# Patient Record
Sex: Male | Born: 1940 | Race: White | Hispanic: No | Marital: Married | Smoking: Former smoker
Health system: Southern US, Community
[De-identification: ages and names within clinical notes are randomized; demographics above are authoritative.]

## PROBLEM LIST (undated history)

## (undated) DIAGNOSIS — I10 Essential (primary) hypertension: Secondary | ICD-10-CM

## (undated) DIAGNOSIS — N289 Disorder of kidney and ureter, unspecified: Secondary | ICD-10-CM

## (undated) DIAGNOSIS — C480 Malignant neoplasm of retroperitoneum: Secondary | ICD-10-CM

## (undated) HISTORY — PX: OTHER SURGICAL HISTORY: SHX169

## (undated) HISTORY — DX: Essential (primary) hypertension: I10

## (undated) HISTORY — PX: CATARACT EXTRACTION: SUR2

## (undated) HISTORY — PX: EYE SURGERY: SHX253

---

## 2000-05-21 HISTORY — PX: HERNIA REPAIR: SHX51

## 2005-05-21 HISTORY — PX: ABDOMINAL SURGERY: SHX537

## 2005-07-17 ENCOUNTER — Ambulatory Visit (HOSPITAL_COMMUNITY): Admission: RE | Admit: 2005-07-17 | Discharge: 2005-07-17 | Payer: Self-pay | Admitting: Ophthalmology

## 2006-03-22 ENCOUNTER — Encounter (INDEPENDENT_AMBULATORY_CARE_PROVIDER_SITE_OTHER): Payer: Self-pay | Admitting: Specialist

## 2006-03-22 ENCOUNTER — Inpatient Hospital Stay (HOSPITAL_COMMUNITY): Admission: RE | Admit: 2006-03-22 | Discharge: 2006-03-29 | Payer: Self-pay

## 2006-04-17 ENCOUNTER — Ambulatory Visit: Payer: Self-pay | Admitting: Hematology & Oncology

## 2006-04-25 LAB — CBC WITH DIFFERENTIAL/PLATELET
Eosinophils Absolute: 0.2 10*3/uL (ref 0.0–0.5)
MONO#: 0.6 10*3/uL (ref 0.1–0.9)
NEUT#: 2.8 10*3/uL (ref 1.5–6.5)
RBC: 4.26 10*6/uL (ref 4.20–5.71)
RDW: 22.1 % — ABNORMAL HIGH (ref 11.2–14.6)
WBC: 5 10*3/uL (ref 4.0–10.0)

## 2006-04-25 LAB — COMPREHENSIVE METABOLIC PANEL
ALT: 10 U/L (ref 0–53)
Albumin: 4 g/dL (ref 3.5–5.2)
Alkaline Phosphatase: 66 U/L (ref 39–117)
CO2: 28 mEq/L (ref 19–32)
Glucose, Bld: 118 mg/dL — ABNORMAL HIGH (ref 70–99)
Potassium: 4.1 mEq/L (ref 3.5–5.3)
Sodium: 142 mEq/L (ref 135–145)
Total Protein: 6.3 g/dL (ref 6.0–8.3)

## 2006-04-25 LAB — LACTATE DEHYDROGENASE: LDH: 134 U/L (ref 94–250)

## 2006-04-29 ENCOUNTER — Ambulatory Visit: Admission: RE | Admit: 2006-04-29 | Discharge: 2006-07-28 | Payer: Self-pay | Admitting: Radiation Oncology

## 2006-05-13 ENCOUNTER — Ambulatory Visit (HOSPITAL_COMMUNITY): Admission: RE | Admit: 2006-05-13 | Discharge: 2006-05-13 | Payer: Self-pay | Admitting: Radiation Oncology

## 2006-05-29 ENCOUNTER — Ambulatory Visit (HOSPITAL_COMMUNITY): Admission: RE | Admit: 2006-05-29 | Discharge: 2006-05-29 | Payer: Self-pay | Admitting: Hematology & Oncology

## 2006-07-15 ENCOUNTER — Ambulatory Visit (HOSPITAL_COMMUNITY): Admission: RE | Admit: 2006-07-15 | Discharge: 2006-07-15 | Payer: Self-pay | Admitting: Radiation Oncology

## 2006-08-06 ENCOUNTER — Ambulatory Visit: Admission: RE | Admit: 2006-08-06 | Discharge: 2006-09-25 | Payer: Self-pay | Admitting: Radiation Oncology

## 2006-10-18 ENCOUNTER — Ambulatory Visit (HOSPITAL_COMMUNITY): Admission: RE | Admit: 2006-10-18 | Discharge: 2006-10-18 | Payer: Self-pay | Admitting: Radiation Oncology

## 2007-01-21 ENCOUNTER — Ambulatory Visit: Admission: RE | Admit: 2007-01-21 | Discharge: 2007-02-11 | Payer: Self-pay | Admitting: Radiation Oncology

## 2007-01-21 LAB — BASIC METABOLIC PANEL
CO2: 25 mEq/L (ref 19–32)
Calcium: 9.8 mg/dL (ref 8.4–10.5)
Creatinine, Ser: 1.23 mg/dL (ref 0.40–1.50)
Sodium: 141 mEq/L (ref 135–145)

## 2007-01-21 LAB — CBC WITH DIFFERENTIAL/PLATELET
BASO%: 0.3 % (ref 0.0–2.0)
EOS%: 3.4 % (ref 0.0–7.0)
LYMPH%: 11 % — ABNORMAL LOW (ref 14.0–48.0)
MCH: 35.7 pg — ABNORMAL HIGH (ref 28.0–33.4)
MCHC: 36.2 g/dL — ABNORMAL HIGH (ref 32.0–35.9)
MONO#: 0.6 10*3/uL (ref 0.1–0.9)
Platelets: 150 10*3/uL (ref 145–400)
RBC: 4.64 10*6/uL (ref 4.20–5.71)
WBC: 4.8 10*3/uL (ref 4.0–10.0)

## 2007-01-31 ENCOUNTER — Ambulatory Visit (HOSPITAL_COMMUNITY): Admission: RE | Admit: 2007-01-31 | Discharge: 2007-01-31 | Payer: Self-pay | Admitting: Radiation Oncology

## 2007-08-06 ENCOUNTER — Ambulatory Visit (HOSPITAL_COMMUNITY): Admission: RE | Admit: 2007-08-06 | Discharge: 2007-08-06 | Payer: Self-pay | Admitting: Radiation Oncology

## 2008-03-15 ENCOUNTER — Ambulatory Visit: Admission: RE | Admit: 2008-03-15 | Discharge: 2008-03-15 | Payer: Self-pay | Admitting: Radiation Oncology

## 2008-03-15 LAB — COMPREHENSIVE METABOLIC PANEL
Albumin: 4.5 g/dL (ref 3.5–5.2)
CO2: 27 mEq/L (ref 19–32)
Calcium: 9.2 mg/dL (ref 8.4–10.5)
Glucose, Bld: 124 mg/dL — ABNORMAL HIGH (ref 70–99)
Potassium: 3.8 mEq/L (ref 3.5–5.3)
Sodium: 134 mEq/L — ABNORMAL LOW (ref 135–145)
Total Protein: 7 g/dL (ref 6.0–8.3)

## 2008-03-15 LAB — CBC WITH DIFFERENTIAL/PLATELET
Eosinophils Absolute: 0.2 10*3/uL (ref 0.0–0.5)
LYMPH%: 12.1 % — ABNORMAL LOW (ref 14.0–48.0)
MONO#: 0.5 10*3/uL (ref 0.1–0.9)
NEUT#: 3.2 10*3/uL (ref 1.5–6.5)
Platelets: 130 10*3/uL — ABNORMAL LOW (ref 145–400)
RBC: 4.46 10*6/uL (ref 4.20–5.71)
WBC: 4.5 10*3/uL (ref 4.0–10.0)

## 2008-03-19 ENCOUNTER — Ambulatory Visit (HOSPITAL_COMMUNITY): Admission: RE | Admit: 2008-03-19 | Discharge: 2008-03-19 | Payer: Self-pay | Admitting: Radiation Oncology

## 2008-09-06 ENCOUNTER — Ambulatory Visit: Admission: RE | Admit: 2008-09-06 | Discharge: 2008-09-06 | Payer: Self-pay | Admitting: Radiation Oncology

## 2008-09-06 LAB — CREATININE, SERUM: Creatinine, Ser: 1.53 mg/dL — ABNORMAL HIGH (ref 0.40–1.50)

## 2008-09-15 ENCOUNTER — Ambulatory Visit (HOSPITAL_COMMUNITY): Admission: RE | Admit: 2008-09-15 | Discharge: 2008-09-15 | Payer: Self-pay | Admitting: Radiation Oncology

## 2009-01-04 IMAGING — PT NM PET TUM IMG SKULL BASE T - THIGH
6 series · 25 of 25 positions shown · non-contrast
Comparison: Prior CT studies of 05/13/06.   There are no prior PET scans for comparison.

CLINICAL DATA: The patient is status post surgical resection of a large liposarcoma of the retroperitoneum on 03/22/06.  Postop for staging CT on 05/13/06 demonstrated two small right-sided pulmonary nodules.  Further evaluation is now performed with PET scan. 
FDG PET-CT TUMOR IMAGING (SKULL BASE TO THIGHS):
Patient Height:  6 feet, 0 inches. 
Fasting Blood Glucose:  98.
TECHNIQUE: 17.7 mCi F-18 FDG were administered via right antecubital fossa.  Full ring PET imaging was performed from the skull base through the mid-thighs 59 minutes after injection.  CT data was obtained and used for attenuation correction and anatomic localization only.  (This was not acquired as a diagnostic CT examination).

[Series 1: pet ac · axial · 3.3mm · 4.69mm/px · z∈[-892,-22]mm · 5 of 267 slices shown]
[im 1/267]
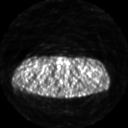
[im 67/267]
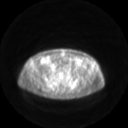
[im 134/267]
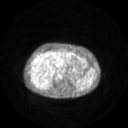
[im 200/267]
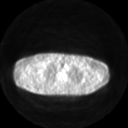
[im 267/267]
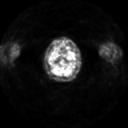

[Series 2: pet nac · axial · 3.3mm · 4.69mm/px · z∈[-892,-22]mm · 6 of 267 slices shown]
[im 1/267]
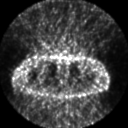
[im 54/267]
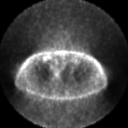
[im 107/267]
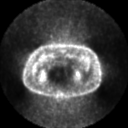
[im 160/267]
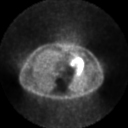
[im 213/267]
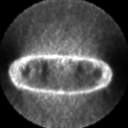
[im 267/267]
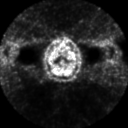

[Series 2: ct images · axial · 3.8mm · 0.98mm/px · z∈[-892,-22]mm · 5 of 267 slices shown]
[im 1/267]
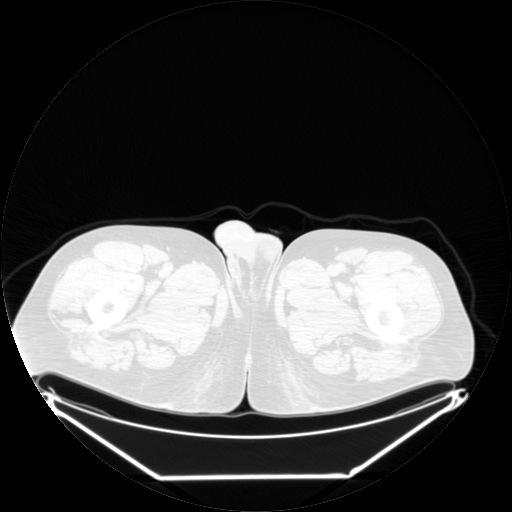
[im 67/267]
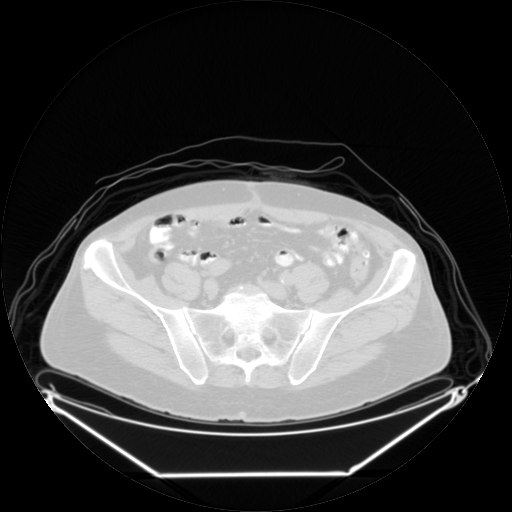
[im 134/267]
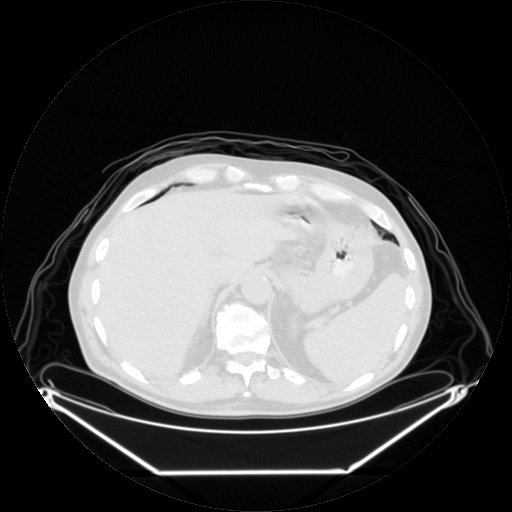
[im 200/267]
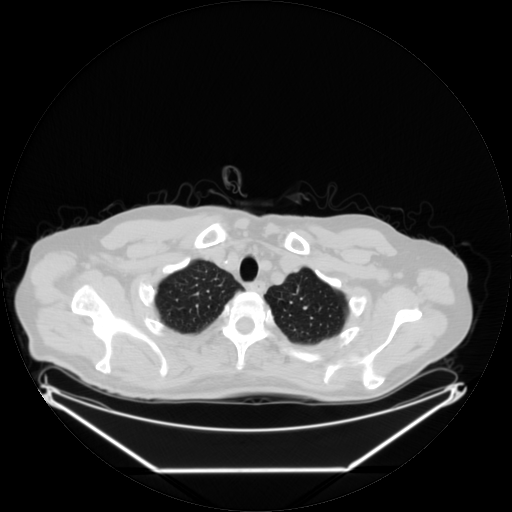
[im 267/267  brain]
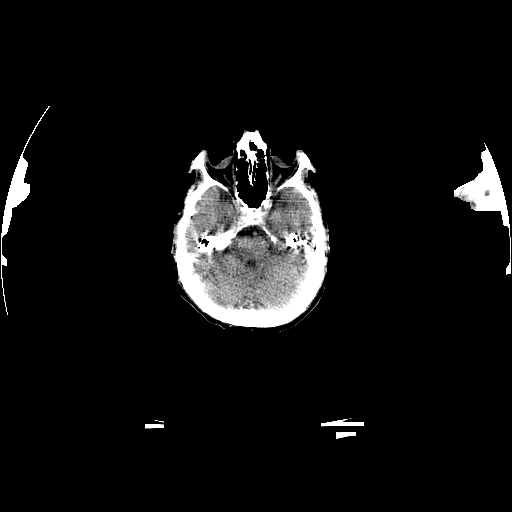

[Series 123: mip · coronal · 3.3mm · 4.69mm/px · 1 of 30 slices shown]
[im 1/30]
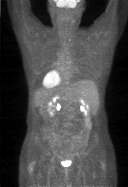

[Series 151: reformatted · axial · 3.3mm · 3.91mm/px · z∈[-892,-22]mm · 6 of 265 slices shown (1 of 2)]
[im 1/265]
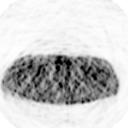
[im 53/265]
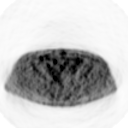
[im 106/265]
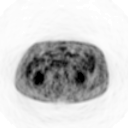
[im 159/265]
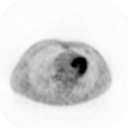
[im 212/265]
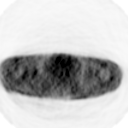
[im 265/265]
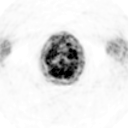

[Series 154: reformatted · coronal · 3.3mm · 6.98mm/px · 2 of 82 slices shown (2 of 2)]
[im 1/82]
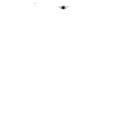
[im 82/82]
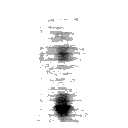

[25 of 25 positions shown; findings below may reference images not displayed]

FINDINGS: In the chest, no abnormal metabolic activity is identified by PET.  At the level of the two small right-sided pulmonary nodules, increased metabolic activity is not detectable.  However, given the small size of these nodules as well as the history of liposarcoma, it is definitely conceivable that small metastatic deposits would not be obviously hypermetabolic by PET scan, especially if the tumor is fairly well differentiated and of slower growth pattern.  It would be advisable to certainly follow up on the nodules by interval chest CT in a few months. 
The rest of the scan demonstrates no abnormal metabolic activity throughout the rest of the visualized body from the skull base to the thighs.  No abnormal lymph node activity is seen.  There are no focal bony lesions.  No abnormal fatty metabolic activity is seen in the abdomen or pelvis at the level of the patient's original tumor.  In the region of clips in the left retroperitoneum, as well as visible scar tissue, no abnormal metabolic activity is detected by PET.  There is some mild midline activity present related to recent surgical incision.
IMPRESSION: No abnormal metabolic activity identified by PET.  The two small pulmonary nodules in the right lung measuring less than 1 cm in diameter do not show obvious hypermetabolic activity by PET.  However, it is conceivable that these still may represent metastatic disease from the patient's known liposarcoma given the small size of the nodules as well as the potential fairly slow doubling rate of the original tumor.

## 2009-03-31 ENCOUNTER — Ambulatory Visit: Admission: RE | Admit: 2009-03-31 | Discharge: 2009-03-31 | Payer: Self-pay | Admitting: Radiation Oncology

## 2009-03-31 LAB — CBC WITH DIFFERENTIAL/PLATELET
Basophils Absolute: 0 10*3/uL (ref 0.0–0.1)
EOS%: 4.3 % (ref 0.0–7.0)
HCT: 44 % (ref 38.4–49.9)
HGB: 15.5 g/dL (ref 13.0–17.1)
LYMPH%: 14.1 % (ref 14.0–49.0)
MCH: 35.8 pg — ABNORMAL HIGH (ref 27.2–33.4)
MCV: 101.7 fL — ABNORMAL HIGH (ref 79.3–98.0)
MONO%: 12.7 % (ref 0.0–14.0)
NEUT%: 68.8 % (ref 39.0–75.0)
Platelets: 139 10*3/uL — ABNORMAL LOW (ref 140–400)

## 2009-03-31 LAB — COMPREHENSIVE METABOLIC PANEL
AST: 37 U/L (ref 0–37)
Alkaline Phosphatase: 53 U/L (ref 39–117)
BUN: 18 mg/dL (ref 6–23)
Calcium: 9.9 mg/dL (ref 8.4–10.5)
Chloride: 100 mEq/L (ref 96–112)
Creatinine, Ser: 1.64 mg/dL — ABNORMAL HIGH (ref 0.40–1.50)
Total Bilirubin: 1.2 mg/dL (ref 0.3–1.2)

## 2009-04-01 ENCOUNTER — Ambulatory Visit (HOSPITAL_COMMUNITY): Admission: RE | Admit: 2009-04-01 | Discharge: 2009-04-01 | Payer: Self-pay | Admitting: Radiation Oncology

## 2009-09-28 ENCOUNTER — Ambulatory Visit: Admission: RE | Admit: 2009-09-28 | Discharge: 2009-09-28 | Payer: Self-pay | Admitting: Radiation Oncology

## 2009-09-28 LAB — CBC WITH DIFFERENTIAL/PLATELET
BASO%: 0.3 % (ref 0.0–2.0)
Basophils Absolute: 0 10*3/uL (ref 0.0–0.1)
EOS%: 3.9 % (ref 0.0–7.0)
HGB: 15.4 g/dL (ref 13.0–17.1)
MCH: 35 pg — ABNORMAL HIGH (ref 27.2–33.4)
MCHC: 35.4 g/dL (ref 32.0–36.0)
MCV: 99.1 fL — ABNORMAL HIGH (ref 79.3–98.0)
MONO%: 15.6 % — ABNORMAL HIGH (ref 0.0–14.0)
RBC: 4.39 10*6/uL (ref 4.20–5.82)
RDW: 13.7 % (ref 11.0–14.6)
lymph#: 1.1 10*3/uL (ref 0.9–3.3)

## 2009-09-28 LAB — COMPREHENSIVE METABOLIC PANEL
ALT: 30 U/L (ref 0–53)
AST: 26 U/L (ref 0–37)
Albumin: 4.5 g/dL (ref 3.5–5.2)
Alkaline Phosphatase: 51 U/L (ref 39–117)
BUN: 20 mg/dL (ref 6–23)
Potassium: 3.9 mEq/L (ref 3.5–5.3)

## 2009-09-30 ENCOUNTER — Ambulatory Visit (HOSPITAL_COMMUNITY): Admission: RE | Admit: 2009-09-30 | Discharge: 2009-09-30 | Payer: Self-pay | Admitting: Radiation Oncology

## 2010-04-17 ENCOUNTER — Encounter (HOSPITAL_COMMUNITY)
Admission: RE | Admit: 2010-04-17 | Discharge: 2010-05-19 | Payer: Self-pay | Source: Home / Self Care | Attending: Radiation Oncology | Admitting: Radiation Oncology

## 2010-04-17 ENCOUNTER — Ambulatory Visit: Payer: Self-pay | Admitting: Oncology

## 2010-04-17 LAB — CBC WITH DIFFERENTIAL/PLATELET
Basophils Absolute: 0 10*3/uL (ref 0.0–0.1)
EOS%: 3.8 % (ref 0.0–7.0)
HCT: 43.7 % (ref 38.4–49.9)
HGB: 15.6 g/dL (ref 13.0–17.1)
MCH: 35.4 pg — ABNORMAL HIGH (ref 27.2–33.4)
MCHC: 35.6 g/dL (ref 32.0–36.0)
MCV: 99.4 fL — ABNORMAL HIGH (ref 79.3–98.0)
MONO%: 11.9 % (ref 0.0–14.0)
NEUT%: 70 % (ref 39.0–75.0)
RDW: 14.3 % (ref 11.0–14.6)

## 2010-04-17 LAB — COMPREHENSIVE METABOLIC PANEL
AST: 25 U/L (ref 0–37)
Alkaline Phosphatase: 52 U/L (ref 39–117)
BUN: 17 mg/dL (ref 6–23)
Creatinine, Ser: 1.79 mg/dL — ABNORMAL HIGH (ref 0.40–1.50)

## 2010-04-18 ENCOUNTER — Ambulatory Visit (HOSPITAL_COMMUNITY)
Admission: RE | Admit: 2010-04-18 | Discharge: 2010-04-18 | Payer: Self-pay | Source: Home / Self Care | Admitting: Radiation Oncology

## 2010-05-03 ENCOUNTER — Inpatient Hospital Stay (HOSPITAL_COMMUNITY)
Admission: RE | Admit: 2010-05-03 | Discharge: 2010-05-08 | Payer: Self-pay | Source: Home / Self Care | Attending: General Surgery | Admitting: General Surgery

## 2010-05-03 ENCOUNTER — Encounter (INDEPENDENT_AMBULATORY_CARE_PROVIDER_SITE_OTHER): Payer: Self-pay | Admitting: General Surgery

## 2010-05-03 HISTORY — PX: COLON SURGERY: SHX602

## 2010-05-25 ENCOUNTER — Ambulatory Visit
Admission: RE | Admit: 2010-05-25 | Discharge: 2010-06-20 | Payer: Self-pay | Source: Home / Self Care | Attending: Radiation Oncology | Admitting: Radiation Oncology

## 2010-06-11 ENCOUNTER — Encounter: Payer: Self-pay | Admitting: Radiation Oncology

## 2010-06-13 ENCOUNTER — Ambulatory Visit: Payer: Self-pay | Admitting: Hematology & Oncology

## 2010-06-21 ENCOUNTER — Ambulatory Visit: Payer: Self-pay | Admitting: Radiation Oncology

## 2010-07-03 ENCOUNTER — Ambulatory Visit: Payer: Medicare Other | Attending: Radiation Oncology | Admitting: Radiation Oncology

## 2010-07-03 DIAGNOSIS — C480 Malignant neoplasm of retroperitoneum: Secondary | ICD-10-CM | POA: Insufficient documentation

## 2010-07-03 LAB — CBC WITH DIFFERENTIAL/PLATELET
Basophils Absolute: 0 10*3/uL (ref 0.0–0.1)
EOS%: 2.6 % (ref 0.0–7.0)
HGB: 14.4 g/dL (ref 13.0–17.1)
MCH: 33.6 pg — ABNORMAL HIGH (ref 27.2–33.4)
MCV: 97.1 fL (ref 79.3–98.0)
MONO%: 9.4 % (ref 0.0–14.0)
NEUT#: 4.2 10*3/uL (ref 1.5–6.5)
RBC: 4.29 10*6/uL (ref 4.20–5.82)
RDW: 15.5 % — ABNORMAL HIGH (ref 11.0–14.6)
lymph#: 0.7 10*3/uL — ABNORMAL LOW (ref 0.9–3.3)

## 2010-07-03 LAB — COMPREHENSIVE METABOLIC PANEL
ALT: 17 U/L (ref 0–53)
AST: 19 U/L (ref 0–37)
Albumin: 4.7 g/dL (ref 3.5–5.2)
Alkaline Phosphatase: 54 U/L (ref 39–117)
Calcium: 10 mg/dL (ref 8.4–10.5)
Chloride: 101 mEq/L (ref 96–112)
Potassium: 4.1 mEq/L (ref 3.5–5.3)
Sodium: 139 mEq/L (ref 135–145)
Total Protein: 7.3 g/dL (ref 6.0–8.3)

## 2010-07-04 ENCOUNTER — Other Ambulatory Visit: Payer: Self-pay | Admitting: Radiation Oncology

## 2010-07-04 DIAGNOSIS — N289 Disorder of kidney and ureter, unspecified: Secondary | ICD-10-CM

## 2010-07-04 DIAGNOSIS — C499 Malignant neoplasm of connective and soft tissue, unspecified: Secondary | ICD-10-CM

## 2010-07-11 ENCOUNTER — Other Ambulatory Visit (HOSPITAL_COMMUNITY): Payer: Medicare Other

## 2010-07-26 ENCOUNTER — Ambulatory Visit (HOSPITAL_COMMUNITY)
Admission: RE | Admit: 2010-07-26 | Discharge: 2010-07-26 | Disposition: A | Discharge status: Home / Self Care | Payer: Medicare Other | Source: Ambulatory Visit | Attending: Radiation Oncology | Admitting: Radiation Oncology

## 2010-07-26 ENCOUNTER — Encounter (HOSPITAL_COMMUNITY): Payer: Self-pay

## 2010-07-26 DIAGNOSIS — N289 Disorder of kidney and ureter, unspecified: Secondary | ICD-10-CM

## 2010-07-26 DIAGNOSIS — C786 Secondary malignant neoplasm of retroperitoneum and peritoneum: Secondary | ICD-10-CM | POA: Insufficient documentation

## 2010-07-26 DIAGNOSIS — I712 Thoracic aortic aneurysm, without rupture, unspecified: Secondary | ICD-10-CM | POA: Insufficient documentation

## 2010-07-26 DIAGNOSIS — J984 Other disorders of lung: Secondary | ICD-10-CM | POA: Insufficient documentation

## 2010-07-26 DIAGNOSIS — C499 Malignant neoplasm of connective and soft tissue, unspecified: Secondary | ICD-10-CM

## 2010-07-26 DIAGNOSIS — C801 Malignant (primary) neoplasm, unspecified: Secondary | ICD-10-CM | POA: Insufficient documentation

## 2010-07-26 HISTORY — DX: Malignant neoplasm of retroperitoneum: C48.0

## 2010-07-26 HISTORY — DX: Disorder of kidney and ureter, unspecified: N28.9

## 2010-07-31 LAB — BASIC METABOLIC PANEL
BUN: 10 mg/dL (ref 6–23)
BUN: 12 mg/dL (ref 6–23)
BUN: 7 mg/dL (ref 6–23)
CO2: 29 mEq/L (ref 19–32)
CO2: 30 mEq/L (ref 19–32)
Calcium: 8.2 mg/dL — ABNORMAL LOW (ref 8.4–10.5)
Calcium: 8.7 mg/dL (ref 8.4–10.5)
Chloride: 102 mEq/L (ref 96–112)
Chloride: 103 mEq/L (ref 96–112)
Chloride: 106 mEq/L (ref 96–112)
Creatinine, Ser: 1.5 mg/dL (ref 0.4–1.5)
Creatinine, Ser: 1.91 mg/dL — ABNORMAL HIGH (ref 0.4–1.5)
GFR calc Af Amer: 43 mL/min — ABNORMAL LOW (ref >60)
GFR calc Af Amer: >60 mL/min (ref >60)
GFR calc Af Amer: >60 mL/min (ref >60)
GFR calc non Af Amer: 35 mL/min — ABNORMAL LOW (ref >60)
GFR calc non Af Amer: 41 mL/min — ABNORMAL LOW (ref >60)
GFR calc non Af Amer: 47 mL/min — ABNORMAL LOW (ref >60)
GFR calc non Af Amer: 50 mL/min — ABNORMAL LOW (ref >60)
Glucose, Bld: 114 mg/dL — ABNORMAL HIGH (ref 70–99)
Glucose, Bld: 126 mg/dL — ABNORMAL HIGH (ref 70–99)
Glucose, Bld: 148 mg/dL — ABNORMAL HIGH (ref 70–99)
Potassium: 3.7 mEq/L (ref 3.5–5.1)
Potassium: 4.3 mEq/L (ref 3.5–5.1)
Potassium: 4.7 mEq/L (ref 3.5–5.1)
Sodium: 139 mEq/L (ref 135–145)
Sodium: 140 mEq/L (ref 135–145)
Sodium: 140 mEq/L (ref 135–145)

## 2010-07-31 LAB — CBC
HCT: 37.2 % — ABNORMAL LOW (ref 39.0–52.0)
HCT: 40.5 % (ref 39.0–52.0)
HCT: 45.5 % (ref 39.0–52.0)
Hemoglobin: 11.5 g/dL — ABNORMAL LOW (ref 13.0–17.0)
Hemoglobin: 12.7 g/dL — ABNORMAL LOW (ref 13.0–17.0)
MCHC: 33.6 g/dL (ref 30.0–36.0)
MCHC: 33.6 g/dL (ref 30.0–36.0)
MCHC: 34 g/dL (ref 30.0–36.0)
MCV: 101.3 fL — ABNORMAL HIGH (ref 78.0–100.0)
MCV: 99.8 fL (ref 78.0–100.0)
Platelets: 108 10*3/uL — ABNORMAL LOW (ref 150–400)
Platelets: 119 10*3/uL — ABNORMAL LOW (ref 150–400)
RBC: 3.36 MIL/uL — ABNORMAL LOW (ref 4.22–5.81)
RBC: 4.56 MIL/uL (ref 4.22–5.81)
RDW: 14.2 % (ref 11.5–15.5)
RDW: 14.5 % (ref 11.5–15.5)
RDW: 14.5 % (ref 11.5–15.5)
WBC: 3.1 10*3/uL — ABNORMAL LOW (ref 4.0–10.5)
WBC: 5.8 10*3/uL (ref 4.0–10.5)

## 2010-07-31 LAB — SURGICAL PCR SCREEN
MRSA, PCR: NEGATIVE
Staphylococcus aureus: NEGATIVE

## 2010-10-06 NOTE — Discharge Summary (Signed)
Jose Haley, Jose Haley NO.:  [id number]   MEDICAL RECORD NO.:  [id number]          PATIENT TYPE:  INP   LOCATION:  1614                         FACILITY:  [facility]   PHYSICIAN:  Lebron Conners, M.D.   DATE OF BIRTH:  [birth year]   DATE OF ADMISSION:  03/22/2006  DATE OF DISCHARGE:  03/29/2006                               DISCHARGE SUMMARY   HISTORY:  Mr. Brandy is a 70 year old white male who developed a large  abdominal mass.  CT scan showed a large retroperitoneal mass mostly  along the left side displacing numerous organs.  He was seen in the  office.  I reviewed his scan and thought it most likely represented of  retroperitoneal sarcoma and that he should be explored.  He was admitted  to the hospital for that purpose on March 22, 2006.  Images and  physical exam showed no evidence of spread of the cancer.   PAST MEDICAL HISTORY:  1. The patient has high blood pressure.  2. There is a history of pneumonia.   MEDICATIONS:  1. Benicar.  2. Toprol.  3. Singulair.  4. Prilosec.   ALLERGIES:  He has a shellfish allergy.   PAST SURGICAL HISTORY:  He has had cataract surgery, eye surgery.   SOCIAL HISTORY:  He does not smoke.  He takes a drink or 2 before  dinner.   PHYSICAL EXAMINATION:  Unremarkable, except for a large very large  abdominal mass.   HOSPITAL COURSE:  On March 22, 2006, he underwent exploration and  excision of a massive retroperitoneal tumor.  The tumor had a gross  total resection, as it was pushing organs rather than invading, except  along the psoas muscle where I took a good deal of muscle along with  tumor when I excised it.  His postoperative course was marked by  moderate ileus which finally resolved.  He was discharged on March 29, 2006, at which time he was eating well and feeling quite well.  Arrangements were made for short-term follow-up in the office.  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  The patient is felt to be at high risk for  recurrence, but there is no definite treatment which might help him  avoid recurrence.  He will have oncology consultation and discussions at  the multi-specialty cancer conference in the near future.   DIAGNOSES:  1. Retroperitoneal sarcoma, dedifferentiated liposarcoma.  2. Hypertension.   OPERATION:  Excision of retroperitoneal sarcoma.   DISCHARGE CONDITION:  Stable and improving daily.      Lebron Conners, M.D.  Electronically Signed     WB/MEDQ  D:  04/16/2006  T:  04/16/2006  Job:  [id number]

## 2010-10-06 NOTE — Op Note (Signed)
NAMEROWDY, GUERRINI NO.:  [id number]   MEDICAL RECORD NO.:  [id number]          PATIENT TYPE:  INP   LOCATION:  0003                         FACILITY:  [facility]   PHYSICIAN:  Lebron Conners, M.D.   DATE OF BIRTH:  1940-10-13   DATE OF PROCEDURE:  03/22/2006  DATE OF DISCHARGE:                                 OPERATIVE REPORT   PREOPERATIVE DIAGNOSIS:  Large retroperitoneal tumor, probable sarcoma.   POSTOPERATIVE DIAGNOSIS:  Large retroperitoneal tumor, probable sarcoma.   OPERATION:  Resection of retroperitoneal tumor.   SURGEON:  Lebron Conners, M.D.   ASSISTANT:  Sheppard Plumber. Earlene Plater, M.D.   ANESTHESIA:  General.   SPECIMEN:  Large tumor.   BLOOD LOSS:  About 100 mL.   COMPLICATIONS:  None.   PROCEDURE:  After the patient was monitored and asleep and had a Foley  catheter and routine preparation and draping of the abdomen, I made a short  midline incision and assessed the abdominal contents.  The large tumor was  present in the left retroperitoneum, crossing over the midline.  There was  no gross evidence of invasion of the anterior abdominal wall or any of the  hollow viscera or any other feature of unresectability.  The liver felt  normal and appeared normal.  There was no retroperitoneal lymphadenopathy.  I then lengthened the incision from xiphoid to most of the way to the pubis  in order to expose the large tumor.  The colon was pushed anteriorly.  I  incised the retroperitoneum lateral to the sigmoid and descending colon and  reflected it medially.  I took the reflection on across the splenic flexure,  clipping and ligating vessels as necessary, until I brought the entire colon  and mesentery medially.  I took care not to damage the vessels in the  mesentery of the colon.  I then dissected further along the medial aspect of  the tumor.  I could not it initially tell where the ureter was.  I first  dissected all the way around the tumor on the  caudad aspect of it and could  see that it was invading or rising from the iliopsoas muscles.  It appeared  that the kidney and ureter and other viscera were displaced medially and  anteriorly.  I then incised further along the lateral retroperitoneum. Then,  using a combination of cautery dissection, blunt dissection, clips, and  division for vessels, I dissected the tumor medially.  I then took the  dissection cephalad and, using mostly blunt dissection with my hand went all  way around the tumor cephalad, and then progressed around the tumor in all  directions.  It did not at any point seemed to involve the aorta or any of  the visceral major vessels.  As I dissected out posterior to it, I took a  considerable amount of muscle with it in hopes of obtaining a gross total  resection.  I identified the ureter coursing medial to the tumor and kept my  dissection lateral to the ureter.  The kidney was mostly pushed anterior to  the  tumor.  I then delivered the tumor on up into the wound and resected the  mass posteriorly by the same technique as already described.  I then packed  the operative area with gauze.  I carefully inspected all the abdominal  contents again and saw no evidence of any metastatic tumor.  I again felt  the retroperitoneum and felt that I had a gross total excision of the mass.  The mass was quite large,  measuring approximately 30 cm in its greatest  dimension.  I then irrigated the operative area and cauterized small  bleeders.  There was one area in which there was slight oozing which  persisted, but I could not see any definite bleeding but blood vessels.  I  packed that with Surgicel and placed a dry gauze on top of that, waited a  few minutes, and the bleeding ceased.  I replaced the colon into the left  abdomen, and then placed the small bowel on top of that and drew down the  omentum.  The sponge, needle, and instrument counts were correct.  I closed  the fascia  with running #1 PDS, tying simple knots at both ends and in the  middle.  I irrigated the subcutaneous tissues and closed the skin with  staples.  The patient tolerated the operation well and went to PACU in good  condition.      Lebron Conners, M.D.  Electronically Signed     WB/MEDQ  D:  03/22/2006  T:  03/22/2006  Job:  [id number]   cc:   Al Decant. Janey Greaser, MD  Fax: [phone]

## 2010-10-30 ENCOUNTER — Ambulatory Visit
Admission: RE | Admit: 2010-10-30 | Discharge: 2010-10-30 | Disposition: A | Discharge status: Home / Self Care | Payer: Medicare Other | Source: Ambulatory Visit | Attending: Radiation Oncology | Admitting: Radiation Oncology

## 2011-02-05 ENCOUNTER — Ambulatory Visit
Admission: RE | Admit: 2011-02-05 | Discharge: 2011-02-05 | Disposition: A | Discharge status: Home / Self Care | Payer: Medicare Other | Source: Ambulatory Visit | Attending: Radiation Oncology | Admitting: Radiation Oncology

## 2011-02-05 ENCOUNTER — Other Ambulatory Visit: Payer: Self-pay | Admitting: Radiation Oncology

## 2011-02-05 DIAGNOSIS — C480 Malignant neoplasm of retroperitoneum: Secondary | ICD-10-CM

## 2011-02-05 DIAGNOSIS — C499 Malignant neoplasm of connective and soft tissue, unspecified: Secondary | ICD-10-CM

## 2011-02-05 LAB — CBC WITH DIFFERENTIAL/PLATELET
Basophils Absolute: 0 10*3/uL (ref 0.0–0.1)
Eosinophils Absolute: 0.2 10*3/uL (ref 0.0–0.5)
HCT: 42.1 % (ref 38.4–49.9)
HGB: 15.1 g/dL (ref 13.0–17.1)
MONO#: 1 10*3/uL — ABNORMAL HIGH (ref 0.1–0.9)
NEUT#: 7.8 10*3/uL — ABNORMAL HIGH (ref 1.5–6.5)
NEUT%: 81 % — ABNORMAL HIGH (ref 39.0–75.0)
RDW: 13.6 % (ref 11.0–14.6)
WBC: 9.7 10*3/uL (ref 4.0–10.3)
lymph#: 0.7 10*3/uL — ABNORMAL LOW (ref 0.9–3.3)

## 2011-02-05 LAB — COMPREHENSIVE METABOLIC PANEL
AST: 21 U/L (ref 0–37)
Albumin: 4.3 g/dL (ref 3.5–5.2)
BUN: 22 mg/dL (ref 6–23)
CO2: 30 mEq/L (ref 19–32)
Calcium: 10.5 mg/dL (ref 8.4–10.5)
Chloride: 98 mEq/L (ref 96–112)
Creatinine, Ser: 1.68 mg/dL — ABNORMAL HIGH (ref 0.50–1.35)
Potassium: 4 mEq/L (ref 3.5–5.3)

## 2011-02-09 ENCOUNTER — Ambulatory Visit (HOSPITAL_COMMUNITY)
Admission: RE | Admit: 2011-02-09 | Discharge: 2011-02-09 | Disposition: A | Discharge status: Home / Self Care | Payer: Medicare Other | Source: Ambulatory Visit | Attending: Radiation Oncology | Admitting: Radiation Oncology

## 2011-02-09 DIAGNOSIS — J984 Other disorders of lung: Secondary | ICD-10-CM | POA: Insufficient documentation

## 2011-02-09 DIAGNOSIS — K5730 Diverticulosis of large intestine without perforation or abscess without bleeding: Secondary | ICD-10-CM | POA: Insufficient documentation

## 2011-02-09 DIAGNOSIS — N433 Hydrocele, unspecified: Secondary | ICD-10-CM | POA: Insufficient documentation

## 2011-02-09 DIAGNOSIS — M47817 Spondylosis without myelopathy or radiculopathy, lumbosacral region: Secondary | ICD-10-CM | POA: Insufficient documentation

## 2011-02-09 DIAGNOSIS — Q619 Cystic kidney disease, unspecified: Secondary | ICD-10-CM | POA: Insufficient documentation

## 2011-02-09 DIAGNOSIS — C499 Malignant neoplasm of connective and soft tissue, unspecified: Secondary | ICD-10-CM

## 2011-02-09 DIAGNOSIS — C480 Malignant neoplasm of retroperitoneum: Secondary | ICD-10-CM

## 2011-02-13 ENCOUNTER — Encounter (INDEPENDENT_AMBULATORY_CARE_PROVIDER_SITE_OTHER): Payer: Self-pay | Admitting: General Surgery

## 2011-02-14 ENCOUNTER — Ambulatory Visit (INDEPENDENT_AMBULATORY_CARE_PROVIDER_SITE_OTHER): Payer: Medicare Other | Admitting: General Surgery

## 2011-02-14 ENCOUNTER — Encounter (INDEPENDENT_AMBULATORY_CARE_PROVIDER_SITE_OTHER): Payer: Self-pay | Admitting: General Surgery

## 2011-02-14 VITALS — BP 142/88 | HR 68 | Temp 96.8°F | Resp 16 | Ht 73.0 in | Wt 228.8 lb

## 2011-02-14 DIAGNOSIS — C494 Malignant neoplasm of connective and soft tissue of abdomen: Secondary | ICD-10-CM

## 2011-02-14 NOTE — Patient Instructions (Signed)
We will arrange referral to Dr. [name] or Dr. Flonnie Hailstone at [facility]

## 2011-02-14 NOTE — Progress Notes (Signed)
Subjective:     Patient ID: Jose Haley, male   DOB: 1941/01/21, 70 y.o.   MRN: [id number]  HPI Patient presents for followup of sarcoma. The patient underwent left colectomy for resection of recurrent sarcoma in the left colon mesentery in December of 2011. He is undergoing followup with oncology. He underwent CT scan of the abdomen and pelvis. This demonstrates a new 5.1 x 3.0 cm mass along the lateral margin of the left lateroconal fascia. He's had no abdominal complaints. His bowels have been moving fine. He has had no pain.  Review of Systems     Objective:   Physical Exam  Constitutional: He appears well-developed and well-nourished.  HENT:  Head: Normocephalic and atraumatic.  Eyes: Conjunctivae are normal. Pupils are equal, round, and reactive to light.  Neck: Normal range of motion. Neck supple.  Cardiovascular: Normal rate and regular rhythm.   No murmur heard. Pulmonary/Chest: Effort normal and breath sounds normal. No respiratory distress. He has no wheezes. He has no rales.  Abdominal: Soft. Bowel sounds are normal. He exhibits no distension and no mass. There is no tenderness. There is no rebound and no guarding.  Musculoskeletal: Normal range of motion.  Exam reveals no cervical, supraclavicular, bilateral axillary, or bilateral inguinal lymphadenopathy.     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 The patient is interested in less invasive approach. I do feel surgery is warranted. At this point , I recommend the patient see Dr. [name] or Dr. Flonnie Hailstone at the surgical oncology department at [facility]. His disease is becoming more aggressive and I feel evaluation there will be the best course of action at this time. The patient wants to optimize his chances for not having further recurrences. We will arrange consultation at [facility].

## 2011-02-20 ENCOUNTER — Telehealth (INDEPENDENT_AMBULATORY_CARE_PROVIDER_SITE_OTHER): Payer: Self-pay

## 2011-02-20 NOTE — Telephone Encounter (Signed)
I called [facility] to check on the referral status of Jose Haley's appointment to Dr Flonnie Hailstone or [name].  I spoke to [facility] and she gave me the appt of October 17th at 0830, arrive at 0815.  She requested his CT disc be mailed to   [facility] Dept of General Surgery attn: Dan Humphreys Addison.  They also need the original path from the 1st sarcoma surgery which I will fax.  I called [facility] radiology and spoke to [place] who will get the disc mailed out today.

## 2011-04-28 ENCOUNTER — Ambulatory Visit (INDEPENDENT_AMBULATORY_CARE_PROVIDER_SITE_OTHER): Payer: Medicare Other

## 2011-04-28 DIAGNOSIS — R300 Dysuria: Secondary | ICD-10-CM

## 2011-04-28 DIAGNOSIS — N390 Urinary tract infection, site not specified: Secondary | ICD-10-CM

## 2013-07-06 ENCOUNTER — Telehealth: Payer: Self-pay | Admitting: Hematology & Oncology

## 2013-07-06 NOTE — Telephone Encounter (Signed)
Check referral note

## 2013-07-08 ENCOUNTER — Other Ambulatory Visit (INDEPENDENT_AMBULATORY_CARE_PROVIDER_SITE_OTHER): Payer: Self-pay

## 2013-07-08 DIAGNOSIS — C480 Malignant neoplasm of retroperitoneum: Secondary | ICD-10-CM

## 2013-07-09 ENCOUNTER — Telehealth: Payer: Self-pay | Admitting: Oncology

## 2013-07-09 NOTE — Telephone Encounter (Signed)
NEW PATIENT SCHEDULED FOR 03/02 @ 12 W/DR. SHERRILL.  REFERRING DR. Lucia Gaskins DX- CANCER OF RETROPERITONEUM

## 2013-07-14 ENCOUNTER — Telehealth: Payer: Self-pay | Admitting: Radiation Oncology

## 2013-07-14 ENCOUNTER — Encounter (INDEPENDENT_AMBULATORY_CARE_PROVIDER_SITE_OTHER): Payer: Self-pay | Admitting: Surgery

## 2013-07-14 ENCOUNTER — Ambulatory Visit (INDEPENDENT_AMBULATORY_CARE_PROVIDER_SITE_OTHER): Payer: Medicare PPO | Admitting: Surgery

## 2013-07-14 VITALS — BP 128/72 | HR 74 | Temp 98.0°F | Resp 18 | Ht 74.0 in | Wt 222.0 lb

## 2013-07-14 DIAGNOSIS — C480 Malignant neoplasm of retroperitoneum: Secondary | ICD-10-CM

## 2013-07-14 NOTE — Progress Notes (Addendum)
Re:   Jose Haley DOB:   May 26, 1969 MRN:   [id number]  ASSESSMENT AND PLAN: 1.  Recurrent large left retroperitoneal sarcoma  This is the third recurrence of this tumor.    It probably involves the left kidney.  Has pushed the spleen up under the left diaphragm.  The question is whether he is better served being treated at a medical center (they are adamant about not returning to [facility]) or treated in [place].  I told them that I would want to review this with Dr. Barry Dienes.  They also have an appointment to see Dr. Benay Spice next week.  I will discuss this with him.  I told them I would call them back in 48 hours about further plans.  [I spoke with Dr. Barry Dienes.  She has suggested that he see Dr. Sheilah Pigeon at [facility].  We will make arrangements for him to be seen. I discussed this with Jose Haley.  DN 07/15/2013]  2.  Chronic renal insufficiency  Creat  - 2.3 - 06/29/2013 3.  HTN 4.  On prednisone  I forgot to ask about why he is on this. 4.  Depression  Chief Complaint  Patient presents with  . Establish Care    retroperi  lipoma   REFERRING PHYSICIAN: Phineas Inches, MD  HISTORY OF PRESENT ILLNESS: Jose Haley is a 73 y.o. (DOB: March 12, 1941)  white  male whose primary care physician is BOUSKA,Angeliah Wisdom E, MD and comes to me today for a recurrent retroperitoneal sarcoma. He is accompanied with his wife and younger son.  Jose Haley comes discussion about the treatment for a recurrent retroperitoneal sarcoma.  Dr. Andris Baumann called me about this patient about one week ago.  He saw Dr. Clovis Riley about 6 months ago.  He had a negative CT scan at [facility] at that time (I do not have those recent records).  At the time, Dr. Clovis Riley told him did not need to be seen for one year.   So the assumption is that he had a normal CT scan. He was doing until about 4 to 6 weeks ago.  It sounds like his symptoms started as back pain.  He thinks that he has lost about 10 pounds over several months.  His appetite  has been poor for about 2 weeks.  His wife is trying to get him to drink Boost.  Dr. Coletta Memos has put him on Oxycodone for the back pain and this has made him constipated.  Dr. Coletta Memos obtained a CT scan on 06/29/2013 that shows a large left retroperitoneal tumor.  The past history of this sarcoma is: Original resection by Dr. Jacinto Reap. Bowman of 30 x 15  X 20 cm mass on 03/22/2014.  Final path was a dedifferentiated high grade liposarcoma.  The path was reviewed by Dr. Sallyanne Havers at [facility] confirming the diagnosis. He has seen Dr. Pearletha Alfred in the past. He had radiation tx completed by Dr. Melina Modena April 2008.  He underwent a left hemicolectomy and liposarcoma excision (1st recurrence) December 2011 by Dr. Jacinto Reap. [place] Silos. He underwent a resection of of recurrent sarcoma (2nd recurrence) by Dr. Roma Kayser 03/29/2011.  His post op course was complicated by a complex wound infection with a low volume fistula that healed.  During that hospitalization, he developed an infection, had hallucinations, and had a bad experience.  He and his wife do not want to return to [facility]. He now presents with his  3rd recurrence of the liposarcoma.    On CT scan done at [facility], 07/03/2013, there is a 22 x 18 x 18 cm mass in the left retroperitoneum.  A smaller 2.9 x 2.4 x 2.1 cm mass is inferior to this.  And the left kidney is pushed medially with questionable involvement in the kidney.   Past Medical History  Diagnosis Date  . Retroperitoneal sarcoma     retroperitoneal sarcoma dx 10/07;  . Renal insufficiency   . Hypertension     Past Surgical History  Procedure Laterality Date  . Eye surgery      rt eye  . Cataract extraction      rt eye  . Abdominal surgery  2007    tumor removed   . Colon surgery  05/03/2010    Left Colectomy, excision liposarcoma   . Hernia repair  2002    inguinal  . Sarcoma excision  2007, 2011     Current Outpatient  Prescriptions  Medication Sig Dispense Refill  . albuterol (PROVENTIL HFA;VENTOLIN HFA) 108 (90 BASE) MCG/ACT inhaler Inhale 2 puffs into the lungs as needed.        Marland Kitchen aspirin 81 MG tablet Take 81 mg by mouth daily.        . clonazePAM (KLONOPIN) 1 MG tablet Take 0.5 mg by mouth 2 (two) times daily as needed.        . diltiazem (TAZTIA XT) 300 MG 24 hr capsule Take 300 mg by mouth daily.        Marland Kitchen latanoprost (XALATAN) 0.005 % ophthalmic solution Place 1 drop into the right eye daily.        Marland Kitchen losartan-hydrochlorothiazide (HYZAAR) 100-25 MG per tablet Take 1 tablet by mouth daily.        Marland Kitchen METOPROLOL TARTRATE PO Take 100 mg by mouth daily.        Marland Kitchen omeprazole (PRILOSEC OTC) 20 MG tablet Take 20 mg by mouth daily.        . Oxycodone HCl 10 MG TABS       . sildenafil (VIAGRA) 25 MG tablet Take 25 mg by mouth daily as needed.        . zolpidem (AMBIEN) 10 MG tablet Take 10 mg by mouth at bedtime as needed.        Marland Kitchen HYDROcodone-acetaminophen (NORCO/VICODIN) 5-325 MG per tablet       . predniSONE (DELTASONE) 10 MG tablet        No current facility-administered medications for this visit.      Allergies  Allergen Reactions  . Shellfish-Derived Products Swelling    Entire body  . Iodinated Diagnostic Agents     REVIEW OF SYSTEMS: Skin:  No history of rash.  No history of abnormal moles. Infection:  No history of hepatitis or HIV.  No history of MRSA. Neurologic:  No history of stroke.  No history of seizure.  No history of headaches.  He had confusion with the sarcoma resection in 2012. Repair of macular hole by Dr. Zadie Rhine in 2007. Cardiac:  HTN.  No history of heart disease.  No history of prior cardiac catheterization.  No history of seeing a cardiologist. Pulmonary:  Does not smoke cigarettes.  No asthma or bronchitis.  No OSA/CPAP.  Endocrine:  No diabetes. No thyroid disease. Gastrointestinal:  No history of stomach disease.  No history of liver disease.  No history of gall bladder  disease.  No history of pancreas disease.  See  HPI. Urologic:  History of chronic renal insufficiency.  Creatinine - 2.3 by Dr. Keturah Barre. Coletta Memos 06/29/2013  Musculoskeletal:  No history of joint or back disease. Hematologic:  No bleeding disorder.  No history of anemia.  Not anticoagulated. Psycho-social:  The patient is oriented.   According to his wife, he is depressed with this diagnosis.  SOCIAL and FAMILY HISTORY: Married. 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  The younger son does Architect, the older is an Optometrist. Note, I took care of his wife when she had a SBO in 2012 (while he husband was sick at [facility]).  PHYSICAL EXAM: BP 128/72  Pulse 74  Temp(Src) 98 F (36.7 C)  Resp 18  Ht 6\' 2"  (1.88 m)  Wt 222 lb (100.699 kg)  BMI 28.49 kg/m2  General: Taller lean WM who is alert.   He looks a little uncomfortable and a little depressed. HEENT: Normal. Pupils equal. Neck: Supple. No mass.  No thyroid mass. Lymph Nodes:  No supraclavicular or cervical nodes. Lungs: Clear to auscultation and symmetric breath sounds. Heart:  RRR. No murmur or rub. Abdomen: Soft. Hard mass at left subcostal margin that goes about 6 cm below the costal margin.  This is the recurrent sarcoma.  He has a well healed midline incision.  But has a hernia in the lower 1/2 of the incision that goes to the left. Rectal: Not done. Extremities:  Good strength and ROM  in upper and lower extremities. Neurologic:  Grossly intact to motor and sensory function. Psychiatric: Somewhat depressed. Behavior is normal.      DATA REVIEWED: Notes from Dr. Marian Sorrow, and Epic data.  Alphonsa Overall, MD,  [facility], [address]  [place], [place]    [place] Phone:  [phone] FAX:  [phone]

## 2013-07-14 NOTE — Telephone Encounter (Signed)
Per JKD, faxed [place] 02/05/11, NPE 04/30/06, EOT 09/18/06 to Dr. Keturah Barre. [place].  Received confirmation.

## 2013-07-17 ENCOUNTER — Telehealth (INDEPENDENT_AMBULATORY_CARE_PROVIDER_SITE_OTHER): Payer: Self-pay

## 2013-07-17 ENCOUNTER — Other Ambulatory Visit (INDEPENDENT_AMBULATORY_CARE_PROVIDER_SITE_OTHER): Payer: Self-pay | Admitting: Surgery

## 2013-07-17 DIAGNOSIS — C480 Malignant neoplasm of retroperitoneum: Secondary | ICD-10-CM

## 2013-07-17 NOTE — Telephone Encounter (Signed)
Notified pt we were processing his referral to Dr. Libby Maw, Surgical Oncology, [facility], [place]  As soon as the appt is scheduled we will call him to p/u CD to take with him.  All other documents to be faxed directly to Dr. Maudie Mercury.

## 2013-07-20 ENCOUNTER — Encounter: Payer: Self-pay | Admitting: Oncology

## 2013-07-20 ENCOUNTER — Telehealth: Payer: Self-pay | Admitting: *Deleted

## 2013-07-20 ENCOUNTER — Telehealth (INDEPENDENT_AMBULATORY_CARE_PROVIDER_SITE_OTHER): Payer: Self-pay

## 2013-07-20 ENCOUNTER — Other Ambulatory Visit: Payer: Self-pay | Admitting: *Deleted

## 2013-07-20 ENCOUNTER — Ambulatory Visit (HOSPITAL_BASED_OUTPATIENT_CLINIC_OR_DEPARTMENT_OTHER): Payer: Medicare PPO | Admitting: Oncology

## 2013-07-20 ENCOUNTER — Ambulatory Visit (HOSPITAL_COMMUNITY)
Admission: RE | Admit: 2013-07-20 | Discharge: 2013-07-20 | Disposition: A | Discharge status: Home / Self Care | Payer: Medicare PPO | Source: Ambulatory Visit | Attending: Oncology | Admitting: Oncology

## 2013-07-20 ENCOUNTER — Telehealth: Payer: Self-pay | Admitting: Oncology

## 2013-07-20 ENCOUNTER — Ambulatory Visit (HOSPITAL_BASED_OUTPATIENT_CLINIC_OR_DEPARTMENT_OTHER): Payer: Medicare PPO

## 2013-07-20 VITALS — BP 80/50 | HR 90 | Temp 97.1°F | Resp 18 | Ht 74.0 in | Wt 219.9 lb

## 2013-07-20 DIAGNOSIS — R0609 Other forms of dyspnea: Secondary | ICD-10-CM | POA: Insufficient documentation

## 2013-07-20 DIAGNOSIS — R0989 Other specified symptoms and signs involving the circulatory and respiratory systems: Principal | ICD-10-CM | POA: Insufficient documentation

## 2013-07-20 DIAGNOSIS — C480 Malignant neoplasm of retroperitoneum: Secondary | ICD-10-CM

## 2013-07-20 DIAGNOSIS — I959 Hypotension, unspecified: Secondary | ICD-10-CM

## 2013-07-20 DIAGNOSIS — N289 Disorder of kidney and ureter, unspecified: Secondary | ICD-10-CM

## 2013-07-20 DIAGNOSIS — Z8589 Personal history of malignant neoplasm of other organs and systems: Secondary | ICD-10-CM | POA: Insufficient documentation

## 2013-07-20 LAB — CBC WITH DIFFERENTIAL/PLATELET
BASO%: 0.5 % (ref 0.0–2.0)
Basophils Absolute: 0.1 10*3/uL (ref 0.0–0.1)
EOS ABS: 0.1 10*3/uL (ref 0.0–0.5)
EOS%: 0.5 % (ref 0.0–7.0)
HCT: 36.4 % — ABNORMAL LOW (ref 38.4–49.9)
HGB: 11.8 g/dL — ABNORMAL LOW (ref 13.0–17.1)
LYMPH%: 4.8 % — AB (ref 14.0–49.0)
MCH: 29.5 pg (ref 27.2–33.4)
MCHC: 32.4 g/dL (ref 32.0–36.0)
MCV: 91 fL (ref 79.3–98.0)
MONO#: 1.5 10*3/uL — AB (ref 0.1–0.9)
MONO%: 9.3 % (ref 0.0–14.0)
NEUT%: 84.9 % — ABNORMAL HIGH (ref 39.0–75.0)
NEUTROS ABS: 13.4 10*3/uL — AB (ref 1.5–6.5)
PLATELETS: 462 10*3/uL — AB (ref 140–400)
RBC: 4 10*6/uL — AB (ref 4.20–5.82)
RDW: 15.1 % — ABNORMAL HIGH (ref 11.0–14.6)
WBC: 15.7 10*3/uL — ABNORMAL HIGH (ref 4.0–10.3)
lymph#: 0.8 10*3/uL — ABNORMAL LOW (ref 0.9–3.3)

## 2013-07-20 LAB — COMPREHENSIVE METABOLIC PANEL (CC13)
ALBUMIN: 2.8 g/dL — AB (ref 3.5–5.0)
ALT: 45 U/L (ref 0–55)
AST: 25 U/L (ref 5–34)
Alkaline Phosphatase: 67 U/L (ref 40–150)
Anion Gap: 11 mEq/L (ref 3–11)
BILIRUBIN TOTAL: 0.8 mg/dL (ref 0.20–1.20)
BUN: 63 mg/dL — ABNORMAL HIGH (ref 7.0–26.0)
CALCIUM: 10 mg/dL (ref 8.4–10.4)
CO2: 28 mEq/L (ref 22–29)
Chloride: 97 mEq/L — ABNORMAL LOW (ref 98–109)
Creatinine: 3.9 mg/dL (ref 0.7–1.3)
GLUCOSE: 147 mg/dL — AB (ref 70–140)
POTASSIUM: 4.4 meq/L (ref 3.5–5.1)
Sodium: 136 mEq/L (ref 136–145)
TOTAL PROTEIN: 7 g/dL (ref 6.4–8.3)

## 2013-07-20 LAB — HOLD TUBE, BLOOD BANK

## 2013-07-20 NOTE — Patient Instructions (Signed)

## 2013-07-20 NOTE — Telephone Encounter (Signed)
Per Dr. Benay Spice, patient needs to hold his BP meds and to be seen by his PCP today. Called office and was told to send him over now as soon as his labs were done here. Wife/patient notified to go to PCP after lab/CXR completed.

## 2013-07-20 NOTE — Progress Notes (Signed)
[facility] Patient Consult   Referring MD: Kamarrion Stfort 73 y.o.  Jun 22, 1940    Reason for Referral: Liposarcoma     HPI: He underwent resection of a retroperitoneal sarcoma in November of 2007. 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 He completed radiation to the left lower abdomen/left flank (5040 cGy in 28 fractions) 08/12/2006 through 09/18/2006. In 2011 he was found to have a mass in the mesenteric fat adjacent to the a sending colon. He was referred to Dr. Grandville Silos and underwent and excision of the mesenteric mass with a descending colectomy on 05/03/2010. 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 Multiple satellite tumor nodules were noted. The proximal and distal colonic resection margins were negative.  In September of 2012 he was found to have a new mass along the lateral margin of the lateroconal fascia. He was referred to Dr. Clovis Riley and underwent resection of the mass in November of 2012. The pathology confirmed a liposarcoma involving left retroperitoneal soft tissue and small bowel. The resection margins were negative. 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 Mitoses were rare.  He saw Dr. Clovis Riley in August 2014 and was in clinical remission. Nodular soft tissue anterior to the left iliac crest had enlarged measuring 1.6 x 2.4 cm. Nodular soft tissue was noted in the left extraperitoneal space lateral to lower pole left kidney measuring 1.6 x 1.9 cm. A 7 mm right lower lobe nodule was stable for greater than 2 years.  He reports left back pain for the past several months. He was referred for a CT of the abdomen and pelvis on 07/03/2013. A large left retroperitoneal mass was noted. A second small separate mass was noted adjacent to the inferior aspect of  the large mass. The mass was noted to distort and deviate the left kidney and was suspected to invade the lower pole of the kidney. The mass also extended to the inferior margin of the spleen no adenopathy. The liver appeared normal.  He was referred to Dr. Lucia Gaskins. I discussed the case with Dr. Lucia Gaskins. He has requested a surgical oncology evaluation by Dr. Maudie Mercury at [facility]. Mr. Blyden has an appointment [facility] on 07/27/2013.  He reports the left flank pain is controlled with the current narcotic regimen. He complains of malaise.      Past Medical History  Diagnosis Date  . Retroperitoneal sarcoma     retroperitoneal sarcoma dx 10/07; liposarcoma   . Renal insufficiency   . Hypertension     Past Surgical History  Procedure Laterality Date  . Eye surgery      rt eye  . Cataract extraction      rt eye  . Abdominal surgery-resection of liposarcoma   2007    tumor removed   . Colon surgery  05/03/2010    Left Colectomy, excision liposarcoma   . Hernia repair  2002    inguinal  . Sarcoma excision  2007, 2011    .   Resection of recurrent sarcoma at [facility]                                                          November 2012  Family History  Problem Relation Age of Onset  . Cancer Father-smoker   76     lung    Current outpatient prescriptions:amLODipine (NORVASC) 5 MG tablet, Take 2.5 mg by mouth daily., Disp: , Rfl: ;  clonazePAM (KLONOPIN) 1 MG tablet, Take 1 mg by mouth at bedtime. , Disp: , Rfl: ;  latanoprost (XALATAN) 0.005 % ophthalmic solution, Place 1 drop into the right eye daily.  , Disp: , Rfl: ;  losartan-hydrochlorothiazide (HYZAAR) 100-25 MG per tablet, Take 1 tablet by mouth daily.  , Disp: , Rfl:  METOPROLOL TARTRATE PO, Take 100 mg by mouth 2 (two) times daily. , Disp: , Rfl: ;  morphine (MS CONTIN) 15 MG 12 hr tablet, Take 15 mg by mouth every 8 (eight) hours. , Disp: , Rfl: ;  omeprazole (PRILOSEC OTC) 20 MG tablet, Take 20 mg by mouth daily.  , Disp: , Rfl: ;   sildenafil (VIAGRA) 25 MG tablet, Take 25 mg by mouth daily as needed.  , Disp: , Rfl: ;  zolpidem (AMBIEN) 10 MG tablet, Take 10 mg by mouth at bedtime as needed.  , Disp: , Rfl:  albuterol (PROVENTIL HFA;VENTOLIN HFA) 108 (90 BASE) MCG/ACT inhaler, Inhale 2 puffs into the lungs as needed.  , Disp: , Rfl: ;  aspirin 81 MG tablet, Take 81 mg by mouth daily.  , Disp: , Rfl: ;  HYDROcodone-acetaminophen (NORCO/VICODIN) 5-325 MG per tablet, , Disp: , Rfl:   Allergies:  Allergies  Allergen Reactions  . Shellfish-Derived Products Swelling    Entire body  . Iodinated Diagnostic Agents     Social History: He is a retired Forensic psychologist. He does not smoke cigarettes. He has a lytic or drink each evening. No known transfusion history. No risk factor for HIV or hepatitis.    History  Alcohol Use  . Yes    Comment: occasional    History  Smoking status  . Former Smoker  . Quit date: 02/14/1979  Smokeless tobacco  . Not on file     ROS:   Positives include: 8-9 pound weight loss, anorexia, exertional chest pain, constipation, pain at the lower posterior neck with physical activity, fatigue, left flank pain improved with morphine  A complete ROS was otherwise negative.  Physical Exam:  Blood pressure 80/50, pulse 90, temperature 97.1 F (36.2 C), temperature source Oral, resp. rate 18, height _0  (1.88 m), weight 219 lb 14.4 oz (99.746 kg), SpO2 98.00%.  HEENT: Oropharynx without visible mass, neck without mass Lungs: Decreased breath sounds at the left lower chest with dullness to percussion, no respiratory distress Cardiac: Regular rate and rhythm, 2/6 systolic murmur Abdomen: No hepatomegaly, no apparent ascites, a mass is palpable in the left abdomen GU: Enlargement of the left testicle (he reports this is chronic)  Vascular: No leg edema Lymph nodes: No cervical, supraclavicular, axillary, or inguinal nodes Neurologic: Alert and oriented, the motor exam is intact in the upper and  lower extremities Skin: No rash Musculoskeletal: No spine tenderness   LAB:  CBC  Lab Results  Component Value Date   WBC 15.7* 07/20/2013   HGB 11.8* 07/20/2013   HCT 36.4* 07/20/2013   MCV 91.0 07/20/2013   PLT 462* 07/20/2013   NEUTROABS 13.4* 07/20/2013     CMP      Component Value Date/Time   NA 136 07/20/2013 1426   NA 138 02/05/2011 1207   K 4.4 07/20/2013 1426   K 4.0 02/05/2011 1207   CL 98  02/05/2011 1207   CO2 28 07/20/2013 1426   CO2 30 02/05/2011 1207   GLUCOSE 147* 07/20/2013 1426   GLUCOSE 96 02/05/2011 1207   BUN 63.0* 07/20/2013 1426   BUN 22 02/05/2011 1207   CREATININE 3.9* 07/20/2013 1426   CREATININE 1.68* 02/05/2011 1207   CALCIUM 10.0 07/20/2013 1426   CALCIUM 10.5 02/05/2011 1207   PROT 7.0 07/20/2013 1426   PROT 6.7 02/05/2011 1207   ALBUMIN 2.8* 07/20/2013 1426   ALBUMIN 4.3 02/05/2011 1207   AST 25 07/20/2013 1426   AST 21 02/05/2011 1207   ALT 45 07/20/2013 1426   ALT 22 02/05/2011 1207   ALKPHOS 67 07/20/2013 1426   ALKPHOS 46 02/05/2011 1207   BILITOT 0.80 07/20/2013 1426   BILITOT 1.0 02/05/2011 1207   GFRNONAA 58* 05/08/2010 0520   GFRAA  Value: >60        The eGFR has been calculated using the MDRD equation. This calculation has not been validated in all clinical situations. eGFR's persistently <60 mL/min signify possible Chronic Kidney Disease. 05/08/2010 0520     Radiology: As per history of present illness    Assessment/Plan:   1. Retroperitoneal liposarcoma-initially diagnosed in 2007, status post surgical resection with positive margins followed by adjuvant radiation  Recurrent disease 2011, status post resection of a mesenteric mass and descending colectomy  Recurrent disease 2012, status post resection of retroperitoneal soft tissue and small bowel in November 2012  CT 07/03/2013 consistent with a recurrent left retroperitoneal sarcoma involving a large mass with probable invasion of the left kidney, separate smaller mass inferior to the large mass  2. Renal  insufficiency  3. Hypotension-we asked him to discontinue blood pressure medications and he will see Dr. Coletta Memos today  4. Exertional dyspnea with diminished left-sided breath sounds-he will be referred for a chest x-ray today   Disposition:   Mr. Sherrer was diagnosed with a dedifferentiated retroperitoneal liposarcoma in 2007. He has developed local recurrence of disease on several occasions and now has a large left retroperitoneal mass consistent with recurrent sarcoma.  I discussed the diagnosis and treatment options with Mr. Streett and his wife. He understands the most effective treatment for this type of sarcoma is surgical resection. He has been referred to Dr. Maudie Mercury at [facility] to consider the possibility of resecting the retroperitoneal mass. I am concerned about the degree of his renal insufficiency. This may lead to increased surgical morbidity and dialysis dependent renal failure if the left kidney is removed. He will need to see a nephrologist prior to surgery. This can be arranged by Dr. Maudie Mercury at [facility] or Dr. Coletta Memos.  Mr. Wiedeman will see Dr. Coletta Memos today to evaluate the hypotension and change in his renal function.  He will return for an office visit here 08/05/2013. We will see him sooner as needed.  [place], [place] 07/20/2013, 5:49 PM

## 2013-07-20 NOTE — Telephone Encounter (Signed)
LMOV pt to call for information regarding referral to [facility] Dr. Maudie Mercury.  Please ask for Delayla Hoffmaster.

## 2013-07-20 NOTE — Progress Notes (Signed)
Presents for new patient appointment with his wife, Cornelia Marlowe Kays). In W/C due to reports of extreme fatigue and he would not be able to walk from lobby to exam room without resting. Reports it exhausts him just to take a shower-uses plastic lawn chair in his walk-in shower. Back pain is well controlled with MS Contin 15 mg every 8 hours. Does not require any breakthrough pain med at this time. Reports neck pain over past week-cause undetermined. Reports only having one bowel movement in the past week (feels he does not eat enough to have any more than this). Last BM was not "hard". He is anxious to hear about his appointment at [facility] with Dr. Maudie Mercury. Prior surgery with Dr. Clovis Riley at [facility], but does not wish to return there. Reports no history of falls in home-however, will be noted as fall risk in office and continue to use w/c.

## 2013-07-20 NOTE — Telephone Encounter (Signed)
gv pt appt schedule for march and referral for cxr to be done today.

## 2013-07-21 ENCOUNTER — Telehealth: Payer: Self-pay | Admitting: *Deleted

## 2013-07-21 NOTE — Telephone Encounter (Signed)
Message copied by Brien Few on Tue Jul 21, 2013  9:12 AM ------      Message from: Betsy Coder B      Created: Mon Jul 20, 2013  8:32 PM       Please call patient,cxr is negative ------

## 2013-07-21 NOTE — Telephone Encounter (Signed)
Called pt with CXR results. He voiced understanding, appreciation for call.

## 2013-07-23 ENCOUNTER — Telehealth: Payer: Self-pay | Admitting: Radiation Oncology

## 2013-07-23 NOTE — Telephone Encounter (Signed)
Faxed EOT to [facility].  OK per JDK.  Received confirmation.

## 2013-07-24 ENCOUNTER — Telehealth: Payer: Self-pay | Admitting: Hematology & Oncology

## 2013-07-24 NOTE — Telephone Encounter (Signed)
Faxed Medical Records via fax today  to:  [place]:  Faythe Dingwall Ph: [phone] Fx: [phone]  Medical  Records requested from 2007 to present   [place] SCANNED

## 2013-08-05 ENCOUNTER — Telehealth: Payer: Self-pay | Admitting: Oncology

## 2013-08-05 ENCOUNTER — Ambulatory Visit (HOSPITAL_BASED_OUTPATIENT_CLINIC_OR_DEPARTMENT_OTHER): Payer: Medicare PPO | Admitting: Nurse Practitioner

## 2013-08-05 VITALS — BP 100/74 | HR 101 | Temp 97.2°F | Resp 18 | Ht 74.0 in | Wt 215.3 lb

## 2013-08-05 DIAGNOSIS — I959 Hypotension, unspecified: Secondary | ICD-10-CM

## 2013-08-05 DIAGNOSIS — R0989 Other specified symptoms and signs involving the circulatory and respiratory systems: Secondary | ICD-10-CM

## 2013-08-05 DIAGNOSIS — R630 Anorexia: Secondary | ICD-10-CM

## 2013-08-05 DIAGNOSIS — N289 Disorder of kidney and ureter, unspecified: Secondary | ICD-10-CM

## 2013-08-05 DIAGNOSIS — R634 Abnormal weight loss: Secondary | ICD-10-CM

## 2013-08-05 DIAGNOSIS — R0609 Other forms of dyspnea: Secondary | ICD-10-CM

## 2013-08-05 DIAGNOSIS — C480 Malignant neoplasm of retroperitoneum: Secondary | ICD-10-CM

## 2013-08-05 NOTE — Telephone Encounter (Signed)
gv pt/wife appt schedule for may.

## 2013-08-06 ENCOUNTER — Telehealth: Payer: Self-pay | Admitting: Oncology

## 2013-08-06 NOTE — Progress Notes (Addendum)
OFFICE PROGRESS NOTE  Interval history:  Mr. Lama returns as scheduled. He continues to have a poor appetite. He is losing weight. He was recently started on a trial of Megace. He reports pain is well-controlled with MS Contin. He is intermittently constipated. He takes MiraLAX as needed. He continues to have intermittent dyspnea on exertion.   Objective: Filed Vitals:   08/05/13 1051  BP: 100/74  Pulse: 101  Temp: 97.2 F (36.2 C)  Resp: 18   Oropharynx is without thrush or ulceration. Breath sounds diminished left lower lung field. No respiratory distress. Regular cardiac rhythm. 2/6 systolic murmur. No hepatomegaly. Palpable mass left abdomen. No leg edema.   Lab Results: Lab Results  Component Value Date   WBC 15.7* 07/20/2013   HGB 11.8* 07/20/2013   HCT 36.4* 07/20/2013   MCV 91.0 07/20/2013   PLT 462* 07/20/2013   NEUTROABS 13.4* 07/20/2013    Chemistry:    Chemistry      Component Value Date/Time   NA 136 07/20/2013 1426   NA 138 02/05/2011 1207   K 4.4 07/20/2013 1426   K 4.0 02/05/2011 1207   CL 98 02/05/2011 1207   CO2 28 07/20/2013 1426   CO2 30 02/05/2011 1207   BUN 63.0* 07/20/2013 1426   BUN 22 02/05/2011 1207   CREATININE 3.9* 07/20/2013 1426   CREATININE 1.68* 02/05/2011 1207      Component Value Date/Time   CALCIUM 10.0 07/20/2013 1426   CALCIUM 10.5 02/05/2011 1207   ALKPHOS 67 07/20/2013 1426   ALKPHOS 46 02/05/2011 1207   AST 25 07/20/2013 1426   AST 21 02/05/2011 1207   ALT 45 07/20/2013 1426   ALT 22 02/05/2011 1207   BILITOT 0.80 07/20/2013 1426   BILITOT 1.0 02/05/2011 1207       Studies/Results: Dg Chest 2 View  07/20/2013   CLINICAL DATA:  Severe dyspnea, history of pelvic sarcoma  EXAM: CHEST  2 VIEW  COMPARISON:  Partial comparison to CT abdomen pelvis dated 07/03/2013. CT chest dated 02/09/2011.  FINDINGS: Stable elevation of the left hemidiaphragm. Associated left basilar scarring/atelectasis. Suspected trace/small left pleural effusion.  Lungs are otherwise clear.  No  pneumothorax.  The heart is normal in size.  Degenerative changes of the visualized thoracolumbar spine.  IMPRESSION: Stable elevation of the left hemidiaphragm with associated left basilar scarring/ atelectasis.  Suspected trace/small left pleural effusion.   Electronically Signed   By: Julian Hy M.D.   On: 07/20/2013 16:16    Medications: I have reviewed the patient's current medications.  Assessment/Plan: 1. Retroperitoneal liposarcoma-initially diagnosed in 2007, status post surgical resection with positive margins followed by adjuvant radiation.  Recurrent disease 2011, status post resection of a mesenteric mass and descending colectomy.  Recurrent disease 2012, status post resection of retroperitoneal soft tissue and small bowel in November 2012.  CT 07/03/2013 consistent with a recurrent left retroperitoneal sarcoma involving a large mass with probable invasion of the left kidney, separate smaller mass inferior to the large mass. 2. Renal insufficiency.  3. Hypotension. Blood pressure medications have been adjusted. 4. Exertional dyspnea with diminished left-sided breath sounds 07/20/2013. Chest x-ray showed stable elevation of the left hemidiaphragm with associated left basilar scarring/atelectasis. Suspected trace/small left pleural effusion. 5. Anorexia/weight loss. He recently began Megace.  Dispositon-he appears stable. He has seen Dr. Maudie Mercury at [facility] regarding the possibility of resection. He is scheduled for additional radiographic evaluation at [facility] later this month and tentatively scheduled for surgery in April. We scheduled  a return visit here on 10/08/2013. He will contact the office prior to that visit if the decision is made to not proceed with surgery or he has any new problems.  Patient seen with Dr. Benay Spice. 25 minutes were spent face-to-face at today's visit with the majority of that time involved in counseling/coordination of care.   Ned Card ANP/GNP-BC   This  was a shared visit with Ned Card. Mr. Hoard will followup at [facility] for additional preoperative evaluation and then resection of the left retroperitoneal mass as indicated.We will see him after surgery for continued oncology followup.We can see him sooner if he is not able to undergo surgery.  Julieanne Manson, M.D.

## 2013-08-06 NOTE — Telephone Encounter (Signed)
, °

## 2013-08-07 ENCOUNTER — Other Ambulatory Visit (HOSPITAL_BASED_OUTPATIENT_CLINIC_OR_DEPARTMENT_OTHER): Payer: Medicare PPO

## 2013-08-07 DIAGNOSIS — C480 Malignant neoplasm of retroperitoneum: Secondary | ICD-10-CM

## 2013-08-07 LAB — COMPREHENSIVE METABOLIC PANEL (CC13)
ALBUMIN: 2.8 g/dL — AB (ref 3.5–5.0)
ALT: 20 U/L (ref 0–55)
AST: 24 U/L (ref 5–34)
Alkaline Phosphatase: 58 U/L (ref 40–150)
Anion Gap: 11 mEq/L (ref 3–11)
BUN: 29 mg/dL — AB (ref 7.0–26.0)
CALCIUM: 9.9 mg/dL (ref 8.4–10.4)
CHLORIDE: 99 meq/L (ref 98–109)
CO2: 26 mEq/L (ref 22–29)
Creatinine: 2.2 mg/dL — ABNORMAL HIGH (ref 0.7–1.3)
Glucose: 124 mg/dl (ref 70–140)
POTASSIUM: 3.9 meq/L (ref 3.5–5.1)
SODIUM: 137 meq/L (ref 136–145)
TOTAL PROTEIN: 6.8 g/dL (ref 6.4–8.3)
Total Bilirubin: 0.8 mg/dL (ref 0.20–1.20)

## 2013-08-07 LAB — CBC WITH DIFFERENTIAL/PLATELET
BASO%: 0.2 % (ref 0.0–2.0)
BASOS ABS: 0 10*3/uL (ref 0.0–0.1)
EOS%: 2 % (ref 0.0–7.0)
Eosinophils Absolute: 0.3 10*3/uL (ref 0.0–0.5)
HCT: 37 % — ABNORMAL LOW (ref 38.4–49.9)
HGB: 11.9 g/dL — ABNORMAL LOW (ref 13.0–17.1)
LYMPH#: 1.2 10*3/uL (ref 0.9–3.3)
LYMPH%: 10 % — ABNORMAL LOW (ref 14.0–49.0)
MCH: 28.3 pg (ref 27.2–33.4)
MCHC: 32.2 g/dL (ref 32.0–36.0)
MCV: 87.9 fL (ref 79.3–98.0)
MONO#: 1.5 10*3/uL — ABNORMAL HIGH (ref 0.1–0.9)
MONO%: 11.9 % (ref 0.0–14.0)
NEUT#: 9.3 10*3/uL — ABNORMAL HIGH (ref 1.5–6.5)
NEUT%: 75.9 % — ABNORMAL HIGH (ref 39.0–75.0)
Platelets: 351 10*3/uL (ref 140–400)
RBC: 4.21 10*6/uL (ref 4.20–5.82)
RDW: 15.2 % — AB (ref 11.0–14.6)
WBC: 12.3 10*3/uL — ABNORMAL HIGH (ref 4.0–10.3)

## 2013-08-10 ENCOUNTER — Encounter (INDEPENDENT_AMBULATORY_CARE_PROVIDER_SITE_OTHER): Payer: Self-pay

## 2013-08-10 ENCOUNTER — Ambulatory Visit: Payer: Medicare PPO | Admitting: Nutrition

## 2013-08-10 ENCOUNTER — Telehealth: Payer: Self-pay | Admitting: *Deleted

## 2013-08-10 NOTE — Telephone Encounter (Signed)
Message copied by Norma Fredrickson on Mon Aug 10, 2013  4:16 PM ------      Message from: Merceda Elks L      Created: Mon Aug 10, 2013  4:09 PM                   ----- Message -----         From: Ladell Pier, MD         Sent: 08/07/2013   5:42 PM           To: Tania Ade, RN            Call patient, creatinine is better, but he should not receive IV contrast for a CT            Copy lab to Dr. Maudie Mercury at [facility] ------

## 2013-08-10 NOTE — Progress Notes (Signed)
73 year old male diagnosed with retroperitoneal liposarcoma in 2007.  He is a patient of Dr. Benay Spice.  Past medical history includes renal insufficiency and hypertension.  Medications include Klonopin, Megace, and Prilosec.  Labs include BUN of 29, creatinine of 2.2, and albumin 2.8.  Height: 6 feet 2 inches. Weight: 212 pounds. Usual body weight: 222 pounds February 2015. BMI: 27.22.   Patient and wife present to nutrition consultation.  Patient states he has a poor appetite, but feels Megace has helped him.  Patient is definitely eating less than usual.  He has intermittent constipation, which is improved with MiraLax.  He is tired of drinking boost and Ensure.  He reports his friend brings a milk shake which he tries to drink daily.  Patient verbalizes frustration with decreased strength.  Patient reports he is unable to get up and walk 10 steps to the bathroom at home.  He presents here in a wheelchair.  Patient reports he has had recent falls.  Patient reports he is scheduled for surgery in 3 weeks.  Nutrition focused Physical exam deferred.  Nutrition diagnosis: Unintended weight loss related to inadequate oral intake as evidenced by 10 pound weight loss over 2 months.  Estimated nutrition needs: 2200-2400 calories, 95-105 g protein, 2.2 L fluid.  Patient meets criteria for Non-severe malnutrition in the context of chronic illness secondary to 5% weight loss over one month and less than 75% of energy intake for greater than one month.  Intervention: Patient educated to increase oral intake in 6 small meals and snacks daily.  Patient was educated on consuming higher calorie, higher protein foods.  Recommended patient try juice-based oral nutrition supplements and provided him with samples to try.  Patient reports he would like to improve strength before surgery.  Communicated with nurse practitioner who will determine appropriateness of physical therapy referral.  Monitoring, evaluation,  goals: Patient will tolerate increased calories and protein throughout the day to minimize further weight loss.  Patient will increase strength for improved quality-of-life.  Next visit: Patient has my contact information for questions or concerns.

## 2013-08-10 NOTE — Telephone Encounter (Signed)
Called and informed patient that creatinine is better, but he should not receive IV contrast for a CT.  Also, copied labs to Dr. Maudie Mercury at [facility].  Per Dr. Benay Spice.  Patient verbalized understanding.

## 2013-08-11 ENCOUNTER — Telehealth: Payer: Self-pay | Admitting: Dietician

## 2013-08-11 NOTE — Telephone Encounter (Signed)
Brief Outpatient Oncology Nutrition Note  Patient has been identified to be at risk on malnutrition screen.  Wt Readings from Last 10 Encounters:  08/10/13 212 lb (96.163 kg)  08/05/13 215 lb 4.8 oz (97.659 kg)  07/20/13 219 lb 14.4 oz (99.746 kg)  07/14/13 222 lb (100.699 kg)  02/14/11 228 lb 12.8 oz (103.783 kg)    Called patient due to weight loss.  Patient was seen by the [place] RD yesterday.  Reports that appetite has improved and eating a little better.  Continues with Magace.  Patient to call as needed for nutrition related questions.  Antonieta Iba, RD, LDN

## 2013-08-12 ENCOUNTER — Other Ambulatory Visit: Payer: Self-pay | Admitting: *Deleted

## 2013-08-12 DIAGNOSIS — C480 Malignant neoplasm of retroperitoneum: Secondary | ICD-10-CM

## 2013-08-12 NOTE — Progress Notes (Signed)
Ned Card, NP suggests OT/PT referral for home due to weakness, unsteady fall risk. Per Dr. Stevphen Meuse to order OT referral only at this time. Referral requested via [facility] and also faxed to [facility] [phone].

## 2013-09-01 ENCOUNTER — Telehealth: Payer: Self-pay | Admitting: *Deleted

## 2013-09-01 NOTE — Telephone Encounter (Signed)
Pt's wife called reporting "Dr. Maudie Mercury from [facility] said surgery was not an option and we want to start chemo as soon as possible"  Wife also stated that they said he has lung cancer (this is a new diagnosis) and wants to see Dr. Benay Spice earlier that 5/21 appt.  Informed pt's wife Dr. Benay Spice will be given information and office will get back with appt date/time.  Pt's wife verbalized understanding.

## 2013-09-01 NOTE — Telephone Encounter (Signed)
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 He does not want to have referral to [facility] oncologist. Wants to return to Dr. Benay Spice. Needs appointment prior to current 10/08/13 visit. Message forwarded to MD

## 2013-09-02 ENCOUNTER — Telehealth: Payer: Self-pay | Admitting: Oncology

## 2013-09-02 ENCOUNTER — Telehealth: Payer: Self-pay | Admitting: *Deleted

## 2013-09-02 NOTE — Telephone Encounter (Signed)
Called pt's wife in response to her call 4/14. Dr. Benay Spice will see pt 4/20 at 1230. She voiced understanding and appreciation for return call. Office note from Dr. Maudie Mercury received by fax today. On Dr. Gearldine Shown desk for review.

## 2013-09-02 NOTE — Telephone Encounter (Signed)
Pt 's wife aware of appt per Lavella Lemons , RN

## 2013-09-03 ENCOUNTER — Other Ambulatory Visit: Payer: Self-pay | Admitting: *Deleted

## 2013-09-07 ENCOUNTER — Other Ambulatory Visit: Payer: Self-pay | Admitting: *Deleted

## 2013-09-07 ENCOUNTER — Ambulatory Visit (HOSPITAL_BASED_OUTPATIENT_CLINIC_OR_DEPARTMENT_OTHER): Payer: Medicare PPO | Admitting: Oncology

## 2013-09-07 VITALS — BP 86/58 | HR 106 | Temp 97.4°F | Resp 18 | Ht 74.0 in

## 2013-09-07 DIAGNOSIS — B370 Candidal stomatitis: Secondary | ICD-10-CM

## 2013-09-07 DIAGNOSIS — R0609 Other forms of dyspnea: Secondary | ICD-10-CM

## 2013-09-07 DIAGNOSIS — C480 Malignant neoplasm of retroperitoneum: Secondary | ICD-10-CM

## 2013-09-07 DIAGNOSIS — R630 Anorexia: Secondary | ICD-10-CM

## 2013-09-07 DIAGNOSIS — R634 Abnormal weight loss: Secondary | ICD-10-CM

## 2013-09-07 DIAGNOSIS — I959 Hypotension, unspecified: Secondary | ICD-10-CM

## 2013-09-07 DIAGNOSIS — R0989 Other specified symptoms and signs involving the circulatory and respiratory systems: Secondary | ICD-10-CM

## 2013-09-07 DIAGNOSIS — N289 Disorder of kidney and ureter, unspecified: Secondary | ICD-10-CM

## 2013-09-07 MED ORDER — PREDNISONE 10 MG PO TABS: 10.0000 mg | ORAL_TABLET | Freq: Every day | ORAL | Status: AC

## 2013-09-07 MED ORDER — FLUCONAZOLE 100 MG PO TABS: 100.0000 mg | ORAL_TABLET | Freq: Every day | ORAL | Status: AC

## 2013-09-07 NOTE — Progress Notes (Signed)
[place] referral to [phone]. Patient/wife have requested DNR and for Dr. Tomasa Hosteller to be the attending for his Hospice care.

## 2013-09-07 NOTE — Progress Notes (Signed)
  [place] OFFICE PROGRESS NOTE   Diagnosis: Liposarcoma  INTERVAL HISTORY:   Jose Haley returns prior to a scheduled visit. Plans for being made for resection of the left retroperitoneal mass at [facility]. Staging CT scans there revealed evidence of metastatic disease to the lungs with bilateral lung nodules and a left pleural effusion. Surgery was canceled secondary to the evidence of disease in the chest and his poor performance status.  Jose Haley returns with his wife. He complains of "weakness ", dyspnea, and anorexia. No pain. He stays on the sofa for the majority of the day. He is dressing himself. He eats very little. Home oxygen has helped the dyspnea.  Objective:  Vital signs in last 24 hours:  Blood pressure 82/56, pulse 130, temperature 97.4 F (36.3 C), temperature source Oral, resp. rate 17, height 6\' 2"  (1.88 m), weight 0 lb (0 kg), SpO2 96.00%. repeat pulse 106    HEENT: Thrush at the palate and buccal mucosa Resp: Decreased breath sounds at the left compared to the right chest, no respiratory distress Cardio: Regular rate and rhythm, tachycardia GI: Large mass occupying the left abdomen Vascular: No leg edema Neuro: Alert, not oriented to year, follows commands  Skin: Diminished turgor   Imaging:  As per history of present illness Medications: I have reviewed the patient's current medications.  Assessment/Plan: 1.Retroperitoneal liposarcoma-initially diagnosed in 2007, status post surgical resection with positive margins followed by adjuvant radiation.  Recurrent disease 2011, status post resection of a mesenteric mass and descending colectomy.  Recurrent disease 2012, status post resection of retroperitoneal soft tissue and small bowel in November 2012.  CT 07/03/2013 consistent with a recurrent left retroperitoneal sarcoma involving a large mass with probable invasion of the left kidney, separate smaller mass inferior to the large mass. CT at [facility]  08/22/2013 with bilateral pulmonary nodules concerning for metastatic disease and a left pleural effusion 2. Renal insufficiency.  3. Hypotension. Likely secondary to malnutrition/Blood pressure medications have been discontinued  4. Exertional dyspnea with diminished left-sided breath sounds 07/20/2013. Chest x-ray showed stable elevation of the left hemidiaphragm with associated left basilar scarring/atelectasis. Suspected trace/small left pleural effusion.  5. Anorexia/weight loss. He recently began Megace without improvement 6. 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 There is CT evidence of metastatic disease involving the lungs. He is not a candidate for resection of the dominant retroperitoneal mass.  I discussed treatment options with Jose Haley and his wife. No therapy will be curative. There is a small chance of clinical improvement with systemic chemotherapy, but his performance status is not adequate to receive chemotherapy. I recommend comfort care. He agrees to a [facility] referral. We discussed CPR and ACLS issues. He will be placed on a no CODE BLUE status.  He will begin a trial of low-dose prednisone for the anorexia. We will prescribe Diflucan for the oral candidiasis.  Jose Haley requested Dr. Tomasa Hosteller serve as the primary hospice physician.  He is not scheduled for a followup appointment at the [facility]. I am available to see him in the future as needed.   Ladell Pier, MD  09/07/2013  3:15 PM

## 2013-09-08 ENCOUNTER — Ambulatory Visit: Payer: Medicare PPO | Admitting: Oncology

## 2013-10-07 ENCOUNTER — Telehealth: Payer: Self-pay | Admitting: Oncology

## 2013-10-07 NOTE — Telephone Encounter (Signed)
Pt's wife called and cancelled appt for tomorrow, pt expired 5/6, notified nurse

## 2013-10-08 ENCOUNTER — Ambulatory Visit: Payer: Medicare PPO | Admitting: Oncology

## 2013-10-19 DEATH — deceased
